# Patient Record
Sex: Female | Born: 1960 | Hispanic: Yes | Marital: Married | Smoking: Never smoker
Health system: Southern US, Community
[De-identification: ages and names within clinical notes are randomized; demographics above are authoritative.]

## PROBLEM LIST (undated history)

## (undated) DIAGNOSIS — I10 Essential (primary) hypertension: Secondary | ICD-10-CM

## (undated) DIAGNOSIS — K219 Gastro-esophageal reflux disease without esophagitis: Secondary | ICD-10-CM

---

## 2005-08-13 ENCOUNTER — Ambulatory Visit: Payer: Self-pay | Admitting: Family Medicine

## 2006-08-16 ENCOUNTER — Ambulatory Visit: Payer: Self-pay | Admitting: Family Medicine

## 2007-08-25 ENCOUNTER — Ambulatory Visit: Payer: Self-pay | Admitting: Certified Nurse Midwife

## 2008-09-07 ENCOUNTER — Ambulatory Visit: Payer: Self-pay | Admitting: Certified Nurse Midwife

## 2009-10-25 ENCOUNTER — Ambulatory Visit: Payer: Self-pay | Admitting: Certified Nurse Midwife

## 2013-05-20 ENCOUNTER — Ambulatory Visit: Payer: Self-pay | Admitting: Family Medicine

## 2014-06-01 ENCOUNTER — Ambulatory Visit: Payer: Self-pay | Admitting: Family Medicine

## 2014-10-04 ENCOUNTER — Emergency Department
Admit: 2014-10-04 | Disposition: A | Discharge status: Home / Self Care | Payer: Self-pay | Admitting: Emergency Medicine

## 2014-10-04 LAB — BASIC METABOLIC PANEL
Anion Gap: 7 (ref 7–16)
BUN: 12 mg/dL
CO2: 24 mmol/L
Calcium, Total: 8.8 mg/dL — ABNORMAL LOW
Chloride: 105 mmol/L
Creatinine: 0.8 mg/dL
EGFR (African American): >60
EGFR (Non-African Amer.): >60
GLUCOSE: 102 mg/dL — AB
POTASSIUM: 3.5 mmol/L
SODIUM: 136 mmol/L

## 2014-10-04 LAB — CBC
HCT: 40.1 % (ref 35.0–47.0)
HGB: 13.2 g/dL (ref 12.0–16.0)
MCH: 26.6 pg (ref 26.0–34.0)
MCHC: 32.8 g/dL (ref 32.0–36.0)
MCV: 81 fL (ref 80–100)
PLATELETS: 285 10*3/uL (ref 150–440)
RBC: 4.95 10*6/uL (ref 3.80–5.20)
RDW: 14.4 % (ref 11.5–14.5)
WBC: 8.6 10*3/uL (ref 3.6–11.0)

## 2014-10-04 LAB — TROPONIN I: Troponin-I: <0.03 ng/mL

## 2015-04-08 ENCOUNTER — Encounter: Payer: Self-pay | Admitting: *Deleted

## 2015-04-11 ENCOUNTER — Ambulatory Visit: Payer: TRICARE For Life (TFL) | Admitting: *Deleted

## 2015-04-11 ENCOUNTER — Ambulatory Visit
Admission: RE | Admit: 2015-04-11 | Discharge: 2015-04-11 | Disposition: A | Discharge status: Home / Self Care | Payer: TRICARE For Life (TFL) | Source: Ambulatory Visit | Attending: Unknown Physician Specialty | Admitting: Unknown Physician Specialty

## 2015-04-11 ENCOUNTER — Encounter
Admission: RE | Disposition: A | Discharge status: Home / Self Care | Payer: Self-pay | Source: Ambulatory Visit | Attending: Unknown Physician Specialty

## 2015-04-11 DIAGNOSIS — D124 Benign neoplasm of descending colon: Secondary | ICD-10-CM | POA: Insufficient documentation

## 2015-04-11 DIAGNOSIS — Z1211 Encounter for screening for malignant neoplasm of colon: Secondary | ICD-10-CM | POA: Diagnosis not present

## 2015-04-11 DIAGNOSIS — K640 First degree hemorrhoids: Secondary | ICD-10-CM | POA: Diagnosis not present

## 2015-04-11 DIAGNOSIS — Z79899 Other long term (current) drug therapy: Secondary | ICD-10-CM | POA: Diagnosis not present

## 2015-04-11 DIAGNOSIS — K219 Gastro-esophageal reflux disease without esophagitis: Secondary | ICD-10-CM | POA: Insufficient documentation

## 2015-04-11 DIAGNOSIS — I10 Essential (primary) hypertension: Secondary | ICD-10-CM | POA: Diagnosis not present

## 2015-04-11 HISTORY — PX: COLONOSCOPY WITH PROPOFOL: SHX5780

## 2015-04-11 HISTORY — DX: Essential (primary) hypertension: I10

## 2015-04-11 HISTORY — DX: Gastro-esophageal reflux disease without esophagitis: K21.9

## 2015-04-11 SURGERY — COLONOSCOPY WITH PROPOFOL
Anesthesia: General

## 2015-04-11 MED ORDER — SODIUM CHLORIDE 0.9 % IV SOLN
INTRAVENOUS | Status: DC
  Administered 2015-04-11: 14:00:00 via INTRAVENOUS

## 2015-04-11 MED ORDER — FENTANYL CITRATE (PF) 100 MCG/2ML IJ SOLN
INTRAMUSCULAR | Status: DC | PRN
  Administered 2015-04-11: 50 ug via INTRAVENOUS

## 2015-04-11 MED ORDER — MIDAZOLAM HCL 2 MG/2ML IJ SOLN
INTRAMUSCULAR | Status: DC | PRN
  Administered 2015-04-11: 1 mg via INTRAVENOUS

## 2015-04-11 MED ORDER — SODIUM CHLORIDE 0.9 % IV SOLN: INTRAVENOUS | Status: DC

## 2015-04-11 MED ORDER — PROPOFOL 500 MG/50ML IV EMUL
INTRAVENOUS | Status: DC | PRN
  Administered 2015-04-11: 120 ug/kg/min via INTRAVENOUS

## 2015-04-11 MED ORDER — PROPOFOL 10 MG/ML IV BOLUS
INTRAVENOUS | Status: DC | PRN
  Administered 2015-04-11: 30 mg via INTRAVENOUS

## 2015-04-11 MED ORDER — LIDOCAINE HCL (CARDIAC) 20 MG/ML IV SOLN
INTRAVENOUS | Status: DC | PRN
  Administered 2015-04-11: 40 mg via INTRAVENOUS

## 2015-04-11 NOTE — Op Note (Signed)
[facility] Gastroenterology Patient Name: Rachel Warner Procedure Date: 04/11/2015 8:51 AM MRN: [id number] Account #: [id number] Date of Birth: [birth year] Admit Type: Outpatient Age: 54 Room: [facility] ROOM 1 Gender: Female Note Status: Finalized Procedure:         Colonoscopy Indications:       Screening for colorectal malignant neoplasm Providers:         Manya Silvas, MD Referring MD:      Glendon Axe (Referring MD) Medicines:         Propofol per Anesthesia Complications:     No immediate complications. Procedure:         Pre-Anesthesia Assessment:                    - After reviewing the risks and benefits, the patient was                     deemed in satisfactory condition to undergo the procedure.                    After obtaining informed consent, the colonoscope was                     passed under direct vision. Throughout the procedure, the                     patient's blood pressure, pulse, and oxygen saturations                     were monitored continuously. The Colonoscope was                     introduced through the anus and advanced to the the cecum,                     identified by appendiceal orifice and ileocecal valve. The                     colonoscopy was performed without difficulty. The patient                     tolerated the procedure well. The quality of the bowel                     preparation was excellent. Findings:      A diminutive polyp was found in the descending colon. The polyp was       sessile. The polyp was removed with a jumbo cold forceps. Resection and       retrieval were complete.      A diminutive polyp was found in the descending colon. The polyp was       sessile. The polyp was removed with a jumbo cold forceps. Resection and       retrieval were complete.      Internal hemorrhoids were found during endoscopy. The hemorrhoids were       small and Grade I (internal hemorrhoids that do not  prolapse).      The exam was otherwise without abnormality. Impression:        - One diminutive polyp in the descending colon. Resected                     and retrieved.                    -  One diminutive polyp in the descending colon. Resected                     and retrieved.                    - Internal hemorrhoids.                    - The examination was otherwise normal. Recommendation:    - Await pathology results. Manya Silvas, MD 04/11/2015 2:57:13 PM This report has been signed electronically. Number of Addenda: 0 Note Initiated On: 04/11/2015 8:51 AM Scope Withdrawal Time: 0 hours 10 minutes 45 seconds  Total Procedure Duration: 0 hours 21 minutes 33 seconds       [facility]

## 2015-04-11 NOTE — Transfer of Care (Signed)
Immediate Anesthesia Transfer of Care Note  Patient: Rachel Warner  Procedure(s) Performed: Procedure(s): COLONOSCOPY WITH PROPOFOL (N/A)  Patient Location: PACU  Anesthesia Type:General  Level of Consciousness: awake, alert , oriented and sedated  Airway & Oxygen Therapy: Patient Spontanous Breathing and Patient connected to nasal cannula oxygen  Post-op Assessment: Report given to RN and Post -op Vital signs reviewed and stable  Post vital signs: Reviewed and stable  Last Vitals:  Filed Vitals:   04/11/15 1355  BP: 134/97  Pulse: 87  Temp: 35.7 C  Resp: 19    Complications: No apparent anesthesia complications

## 2015-04-11 NOTE — Anesthesia Postprocedure Evaluation (Signed)
  Anesthesia Post-op Note  Patient: Rachel Warner  Procedure(s) Performed: Procedure(s): COLONOSCOPY WITH PROPOFOL (N/A)  Anesthesia type:General  Patient location: PACU  Post pain: Pain level controlled  Post assessment: Post-op Vital signs reviewed, Patient's Cardiovascular Status Stable, Respiratory Function Stable, Patent Airway and No signs of Nausea or vomiting  Post vital signs: Reviewed and stable  Last Vitals:  Filed Vitals:   04/11/15 1355  BP: 134/97  Pulse: 87  Temp: 35.7 C  Resp: 19    Level of consciousness: awake, alert  and patient cooperative  Complications: No apparent anesthesia complications

## 2015-04-11 NOTE — H&P (Signed)
   Primary Care Physician:  Glendon Axe, MD Primary Gastroenterologist:  Dr. Vira Agar  Pre-Procedure History & Physical: HPI:  Rachel Warner is a 54 y.o. female is here for an colonoscopy.   Past Medical History  Diagnosis Date  . Hypertension   . GERD (gastroesophageal reflux disease)     History reviewed. No pertinent past surgical history.  Prior to Admission medications   Medication Sig Start Date End Date Taking? Authorizing Provider  amLODipine (NORVASC) 2.5 MG tablet Take 2.5 mg by mouth daily.   Yes Historical Provider, MD  fluticasone (FLONASE) 50 MCG/ACT nasal spray Place 2 sprays into both nostrils daily.   Yes Historical Provider, MD  levocetirizine (XYZAL) 5 MG tablet Take 5 mg by mouth every evening.   Yes Historical Provider, MD  lisinopril (PRINIVIL,ZESTRIL) 20 MG tablet Take 20 mg by mouth daily.   Yes Historical Provider, MD  meclizine (ANTIVERT) 25 MG tablet Take 25 mg by mouth 2 (two) times daily as needed for dizziness.   Yes Historical Provider, MD  meloxicam (MOBIC) 15 MG tablet Take 15 mg by mouth daily.   Yes Historical Provider, MD  omeprazole (PRILOSEC) 20 MG capsule Take 20 mg by mouth daily.   Yes Historical Provider, MD    Allergies as of 02/16/2015  . (Not on File)    History reviewed. No pertinent family history.  Social History   Social History  . Marital Status: Married    Spouse Name: N/A  . Number of Children: N/A  . Years of Education: N/A   Occupational History  . Not on file.   Social History Main Topics  . Smoking status: Never Smoker   . Smokeless tobacco: Never Used  . Alcohol Use: No  . Drug Use: No  . Sexual Activity: Not on file   Other Topics Concern  . Not on file   Social History Narrative  . No narrative on file    Review of Systems: See HPI, otherwise negative ROS  Physical Exam: BP 134/97 mmHg  Pulse 87  Temp(Src) 96.2 F (35.7 C) (Tympanic)  Resp 19  Ht 5\' 6"  (1.676 m)  Wt 83.915 kg (185 lb)  BMI  29.87 kg/m2  SpO2 100% General:   Alert,  pleasant and cooperative in NAD Head:  Normocephalic and atraumatic. Neck:  Supple; no masses or thyromegaly. Lungs:  Clear throughout to auscultation.    Heart:  Regular rate and rhythm. Abdomen:  Soft, nontender and nondistended. Normal bowel sounds, without guarding, and without rebound.   Neurologic:  Alert and  oriented x4;  grossly normal neurologically.  Impression/Plan: Rachel Warner is here for an colonoscopy to be performed for screening colon  Risks, benefits, limitations, and alternatives regarding  colonoscopy have been reviewed with the patient.  Questions have been answered.  All parties agreeable.   Gaylyn Cheers, MD  04/11/2015, 2:25 PM

## 2015-04-11 NOTE — Anesthesia Procedure Notes (Signed)
Performed by: COOK-MARTIN, Manning Luna Pre-anesthesia Checklist: Patient identified, Emergency Drugs available, Suction available, Patient being monitored and Timeout performed Patient Re-evaluated:Patient Re-evaluated prior to inductionOxygen Delivery Method: Nasal cannula Preoxygenation: Pre-oxygenation with 100% oxygen Intubation Type: IV induction Placement Confirmation: positive ETCO2 and CO2 detector       

## 2015-04-11 NOTE — Anesthesia Preprocedure Evaluation (Addendum)
Anesthesia Evaluation  Patient identified by MRN, date of birth, ID band Patient awake  General Assessment Comment:Hx and consent all via interpreter.  Reviewed: Allergy & Precautions, NPO status , Patient's Chart, lab work & pertinent test results  Airway Mallampati: II  TM Distance: >3 FB Neck ROM: Full    Dental  (+) Teeth Intact   Pulmonary    Pulmonary exam normal        Cardiovascular Exercise Tolerance: Good hypertension, Pt. on medications Normal cardiovascular exam     Neuro/Psych    GI/Hepatic GERD  Medicated and Controlled,  Endo/Other    Renal/GU      Musculoskeletal   Abdominal Normal abdominal exam  (+)   Peds  Hematology   Anesthesia Other Findings No menses for over 7 months and not sexually active.  Reproductive/Obstetrics                            Anesthesia Physical Anesthesia Plan  ASA: II  Anesthesia Plan: General   Post-op Pain Management:    Induction: Intravenous  Airway Management Planned: Nasal Cannula  Additional Equipment:   Intra-op Plan:   Post-operative Plan:   Informed Consent: I have reviewed the patients History and Physical, chart, labs and discussed the procedure including the risks, benefits and alternatives for the proposed anesthesia with the patient or authorized representative who has indicated his/her understanding and acceptance.     Plan Discussed with: CRNA  Anesthesia Plan Comments: (Via interpreter.)        Anesthesia Quick Evaluation

## 2015-04-12 ENCOUNTER — Encounter: Payer: Self-pay | Admitting: Unknown Physician Specialty

## 2015-04-13 LAB — SURGICAL PATHOLOGY

## 2015-04-27 ENCOUNTER — Other Ambulatory Visit: Payer: Self-pay | Admitting: Internal Medicine

## 2015-04-27 DIAGNOSIS — R51 Headache: Principal | ICD-10-CM

## 2015-04-27 DIAGNOSIS — R519 Headache, unspecified: Secondary | ICD-10-CM

## 2015-05-18 ENCOUNTER — Other Ambulatory Visit: Payer: Self-pay | Admitting: Internal Medicine

## 2015-05-18 ENCOUNTER — Ambulatory Visit
Admission: RE | Admit: 2015-05-18 | Discharge: 2015-05-18 | Disposition: A | Discharge status: Home / Self Care | Source: Ambulatory Visit | Attending: Internal Medicine | Admitting: Internal Medicine

## 2015-05-18 DIAGNOSIS — R51 Headache: Secondary | ICD-10-CM | POA: Insufficient documentation

## 2015-05-18 DIAGNOSIS — R42 Dizziness and giddiness: Secondary | ICD-10-CM | POA: Diagnosis not present

## 2015-05-18 DIAGNOSIS — R531 Weakness: Secondary | ICD-10-CM | POA: Insufficient documentation

## 2015-05-18 DIAGNOSIS — R519 Headache, unspecified: Secondary | ICD-10-CM

## 2015-05-18 MED ORDER — GADOBENATE DIMEGLUMINE 529 MG/ML IV SOLN
20.0000 mL | Freq: Once | INTRAVENOUS | Status: AC | PRN
  Administered 2015-05-18: 17 mL via INTRAVENOUS

## 2017-12-16 ENCOUNTER — Other Ambulatory Visit: Payer: Self-pay | Admitting: Internal Medicine

## 2017-12-16 DIAGNOSIS — R748 Abnormal levels of other serum enzymes: Secondary | ICD-10-CM

## 2018-08-06 ENCOUNTER — Other Ambulatory Visit: Payer: Self-pay | Admitting: Internal Medicine

## 2018-08-06 DIAGNOSIS — Z1231 Encounter for screening mammogram for malignant neoplasm of breast: Secondary | ICD-10-CM

## 2018-11-05 ENCOUNTER — Encounter

## 2019-06-16 ENCOUNTER — Ambulatory Visit
Admission: RE | Admit: 2019-06-16 | Discharge: 2019-06-16 | Disposition: A | Discharge status: Home / Self Care | Source: Ambulatory Visit | Attending: Internal Medicine | Admitting: Internal Medicine

## 2019-06-16 DIAGNOSIS — Z1231 Encounter for screening mammogram for malignant neoplasm of breast: Secondary | ICD-10-CM | POA: Insufficient documentation

## 2019-08-29 ENCOUNTER — Ambulatory Visit: Attending: Internal Medicine

## 2019-08-29 DIAGNOSIS — Z23 Encounter for immunization: Secondary | ICD-10-CM

## 2019-08-29 NOTE — Progress Notes (Signed)
   Covid-19 Vaccination Clinic  Name:  Rachel Warner    MRN: [id number] DOB: 1961-04-08  08/29/2019  [name] was observed post Covid-19 immunization for 15 minutes without incident. She was provided with Vaccine Information Sheet and instruction to access the V-Safe system.   [name] was instructed to call 911 with any severe reactions post vaccine: Marland Kitchen Difficulty breathing  . Swelling of face and throat  . A fast heartbeat  . A bad rash all over body  . Dizziness and weakness   Immunizations Administered    Name Date Dose VIS Date Route   Pfizer COVID-19 Vaccine 08/29/2019 11:00 AM 0.3 mL 05/22/2019 Intramuscular   Manufacturer: [place]   Lot: [id number]   [place]: [id number]

## 2019-09-19 ENCOUNTER — Ambulatory Visit: Attending: Internal Medicine

## 2019-09-19 DIAGNOSIS — Z23 Encounter for immunization: Secondary | ICD-10-CM

## 2019-09-19 NOTE — Progress Notes (Signed)
   Covid-19 Vaccination Clinic  Name:  Rachel Warner    MRN: [id number] DOB: 1960/07/01  09/19/2019  [name] was observed post Covid-19 immunization for 15 minutes   without incident. She was provided with Vaccine Information Sheet and instruction to access the V-Safe system.   [name] was instructed to call 911 with any severe reactions post vaccine: Marland Kitchen Difficulty breathing  . Swelling of face and throat  . A fast heartbeat  . A bad rash all over body  . Dizziness and weakness   Immunizations Administered    Name Date Dose VIS Date Route   Pfizer COVID-19 Vaccine 09/19/2019 10:08 AM 0.3 mL 05/22/2019 Intramuscular   Manufacturer: [place]   Lot: [id number]   [place]: [id number]

## 2020-10-07 ENCOUNTER — Other Ambulatory Visit: Payer: Self-pay

## 2020-10-07 DIAGNOSIS — Z1231 Encounter for screening mammogram for malignant neoplasm of breast: Secondary | ICD-10-CM

## 2020-10-13 ENCOUNTER — Encounter

## 2020-10-14 ENCOUNTER — Other Ambulatory Visit: Payer: Self-pay

## 2020-10-14 ENCOUNTER — Ambulatory Visit
Admission: RE | Admit: 2020-10-14 | Discharge: 2020-10-14 | Disposition: A | Discharge status: Home / Self Care | Source: Ambulatory Visit

## 2020-10-14 DIAGNOSIS — Z1231 Encounter for screening mammogram for malignant neoplasm of breast: Secondary | ICD-10-CM | POA: Diagnosis not present

## 2021-10-04 ENCOUNTER — Other Ambulatory Visit: Payer: Self-pay | Admitting: Family Medicine

## 2021-10-04 DIAGNOSIS — Z1231 Encounter for screening mammogram for malignant neoplasm of breast: Secondary | ICD-10-CM

## 2021-11-07 ENCOUNTER — Ambulatory Visit
Admission: RE | Admit: 2021-11-07 | Discharge: 2021-11-07 | Disposition: A | Discharge status: Home / Self Care | Source: Ambulatory Visit | Attending: Family Medicine | Admitting: Family Medicine

## 2021-11-07 DIAGNOSIS — Z1231 Encounter for screening mammogram for malignant neoplasm of breast: Secondary | ICD-10-CM | POA: Insufficient documentation

## 2022-08-01 IMAGING — MG MM DIGITAL SCREENING BILAT W/ TOMO AND CAD
8 series · 8 of 24 positions shown · non-contrast
Comparison: Previous exam(s).

CLINICAL DATA: Screening.

EXAM:
DIGITAL SCREENING BILATERAL MAMMOGRAM WITH TOMOSYNTHESIS AND CAD
TECHNIQUE: Bilateral screening digital craniocaudal and mediolateral oblique
mammograms were obtained. Bilateral screening digital breast
tomosynthesis was performed. The images were evaluated with
computer-aided detection.

[R CC synth-2D]
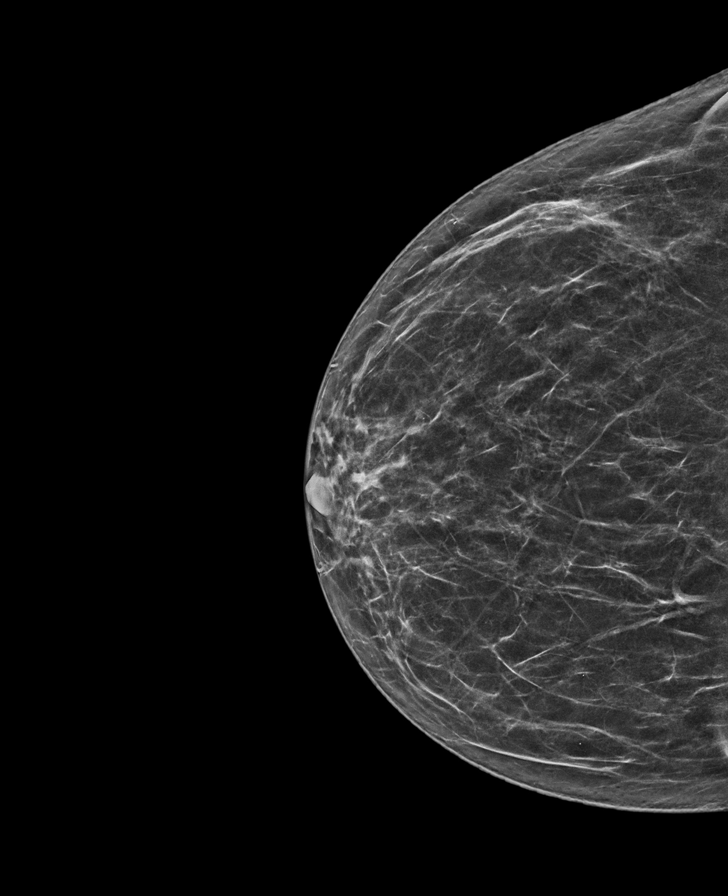

[R MLO synth-2D]
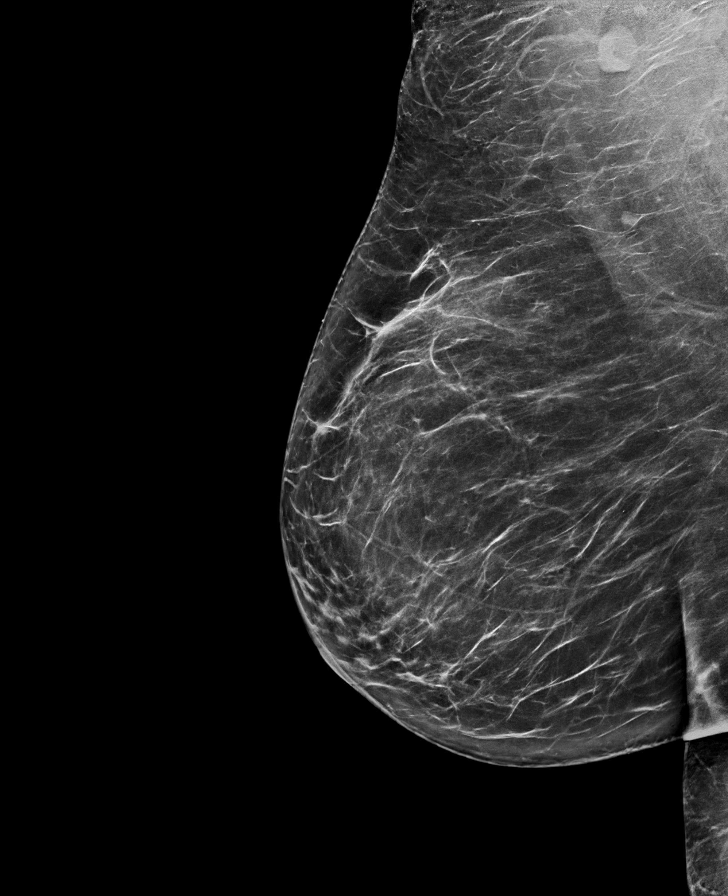

[L MLO synth-2D]
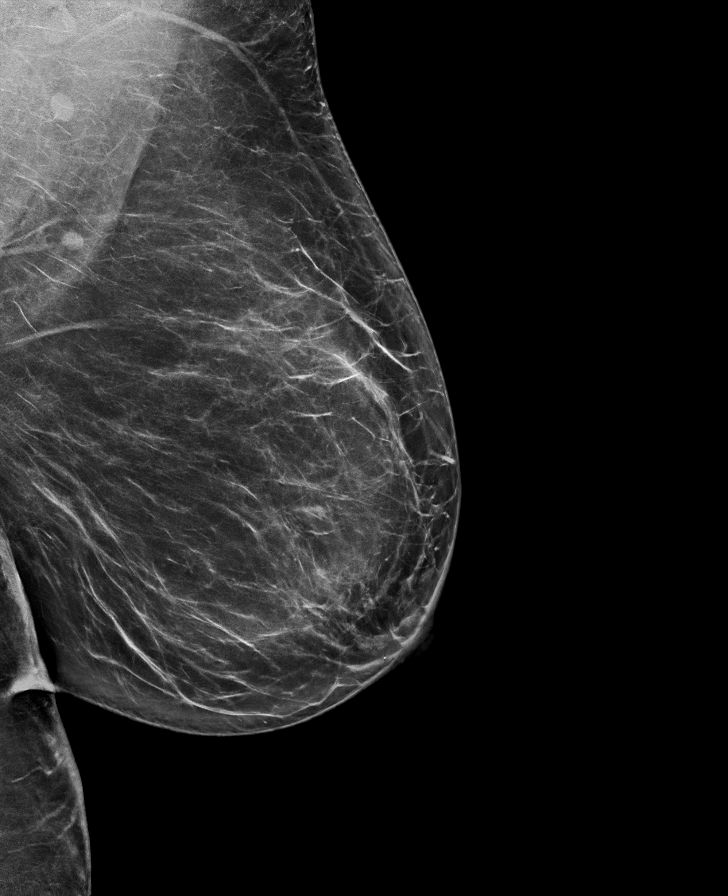

[L CC synth-2D]
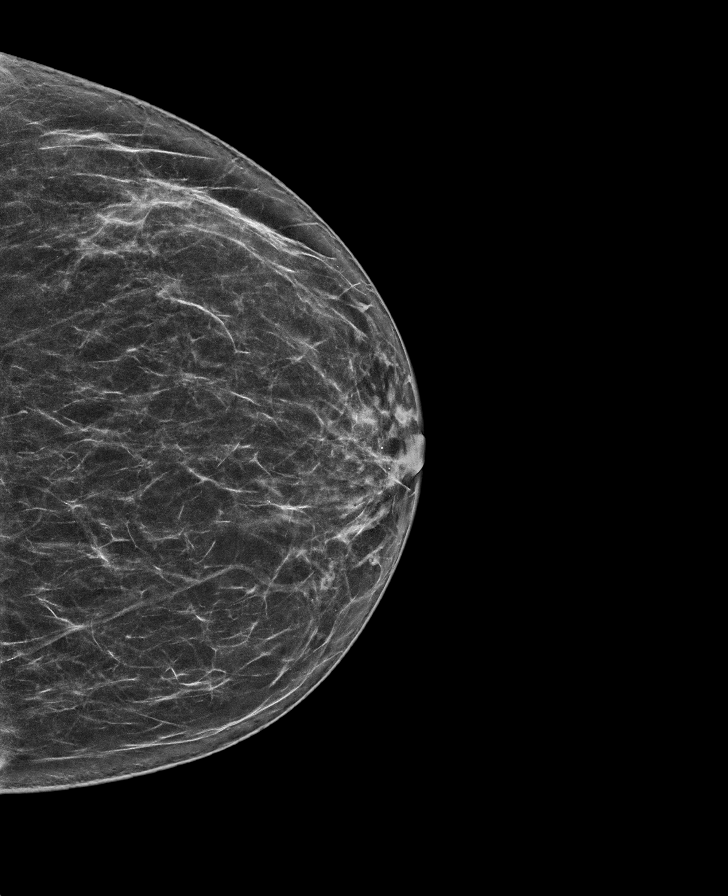

[R MLO tomo · tomo slice 43/86.0]
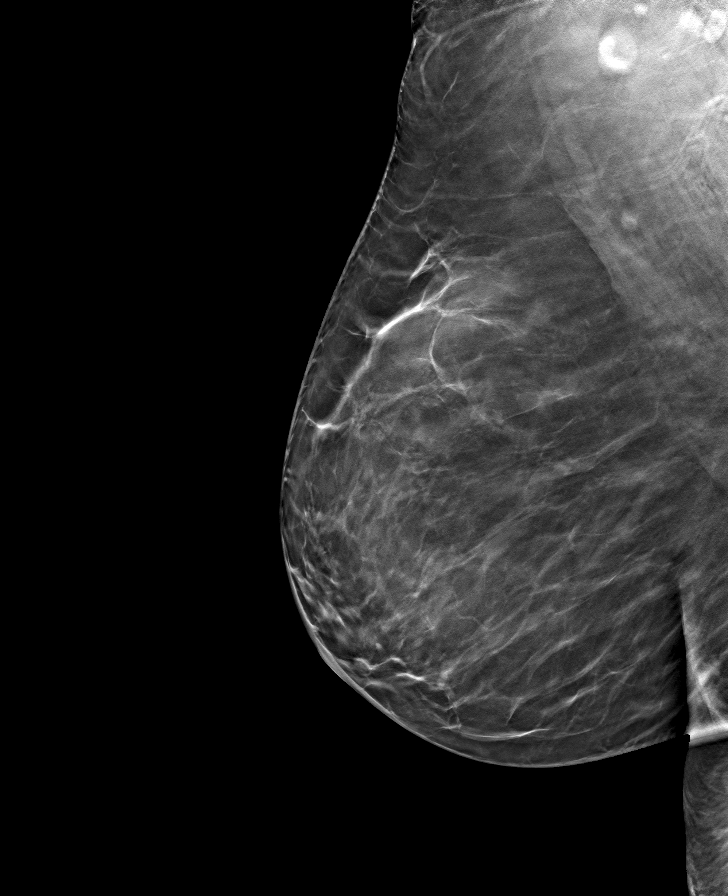

[R CC tomo · tomo slice 36/71.0]
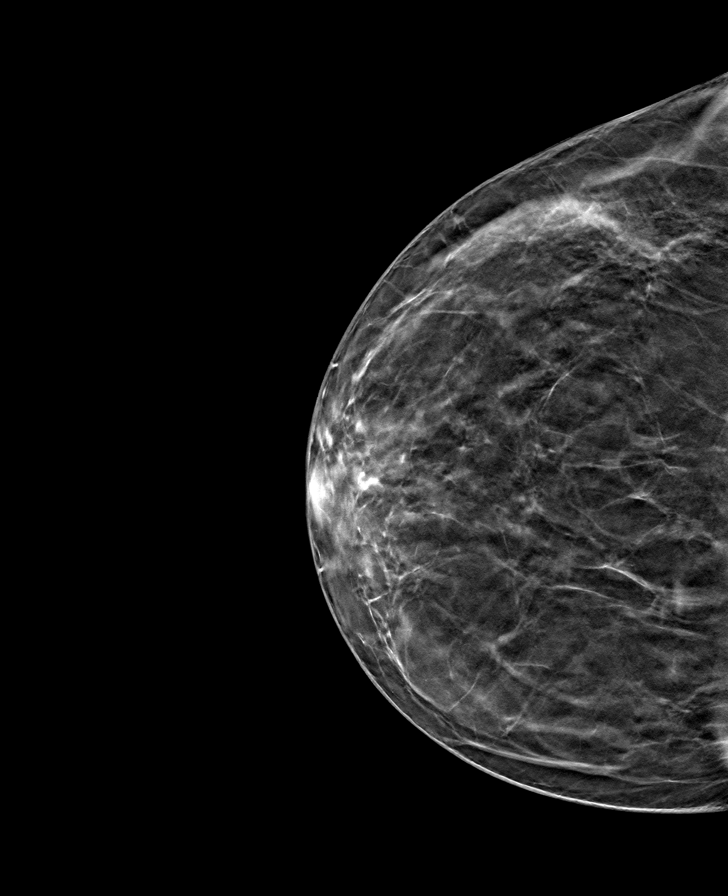

[L CC tomo · tomo slice 38/75.0]
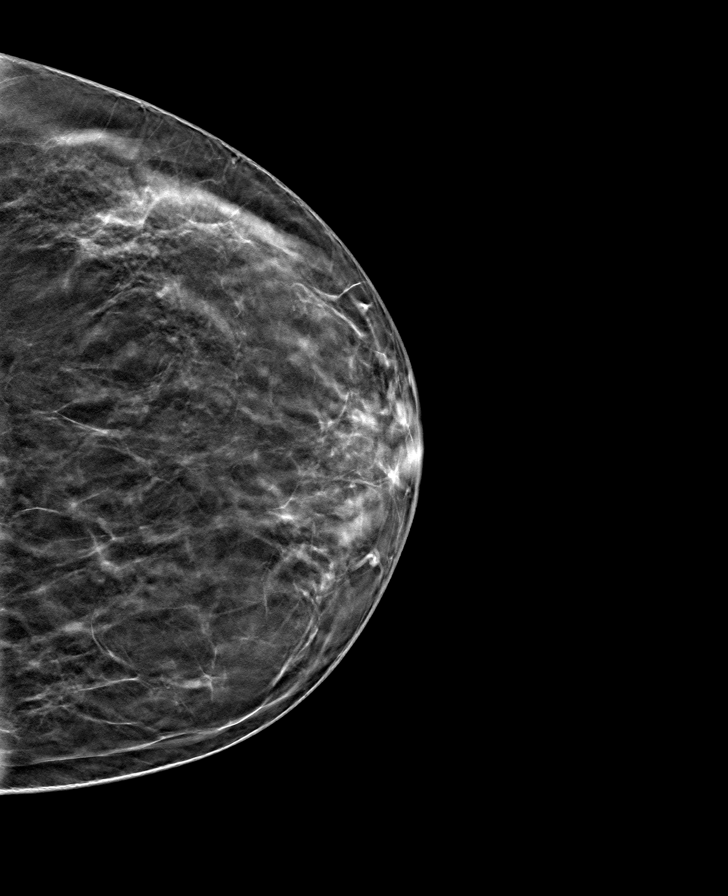

[L MLO tomo · tomo slice 47/92.0]
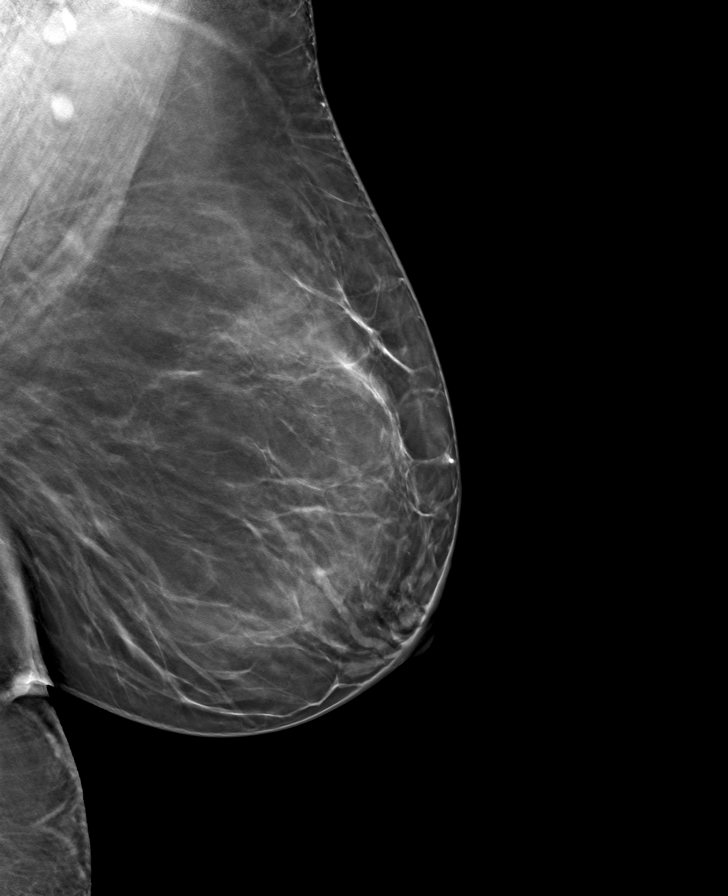

[8 of 24 positions shown; findings below may reference images not displayed]

ACR Breast Density Category b: There are scattered areas of
fibroglandular density.
FINDINGS: There are no findings suspicious for malignancy.
IMPRESSION: No mammographic evidence of malignancy. A result letter of this
screening mammogram will be mailed directly to the patient.

RECOMMENDATION:
Screening mammogram in one year. (Code:51-O-LD2)

BI-RADS CATEGORY  1: Negative.

## 2022-10-05 ENCOUNTER — Other Ambulatory Visit: Payer: Self-pay | Admitting: Family Medicine

## 2022-10-05 DIAGNOSIS — Z1231 Encounter for screening mammogram for malignant neoplasm of breast: Secondary | ICD-10-CM

## 2022-11-09 ENCOUNTER — Ambulatory Visit
Admission: RE | Admit: 2022-11-09 | Discharge: 2022-11-09 | Disposition: A | Discharge status: Home / Self Care | Source: Ambulatory Visit | Attending: Family Medicine | Admitting: Family Medicine

## 2022-11-09 DIAGNOSIS — Z1231 Encounter for screening mammogram for malignant neoplasm of breast: Secondary | ICD-10-CM | POA: Diagnosis present

## 2023-10-11 ENCOUNTER — Other Ambulatory Visit: Payer: Self-pay | Admitting: Family Medicine

## 2023-10-11 DIAGNOSIS — Z1231 Encounter for screening mammogram for malignant neoplasm of breast: Secondary | ICD-10-CM

## 2023-11-11 ENCOUNTER — Ambulatory Visit
Admission: RE | Admit: 2023-11-11 | Discharge: 2023-11-11 | Disposition: A | Discharge status: Home / Self Care | Source: Ambulatory Visit | Attending: Family Medicine | Admitting: Family Medicine

## 2023-11-11 DIAGNOSIS — Z1231 Encounter for screening mammogram for malignant neoplasm of breast: Secondary | ICD-10-CM | POA: Diagnosis present

## 2024-03-12 ENCOUNTER — Emergency Department

## 2024-03-12 ENCOUNTER — Other Ambulatory Visit: Payer: Self-pay

## 2024-03-12 ENCOUNTER — Emergency Department
Admission: EM | Admit: 2024-03-12 | Discharge: 2024-03-12 | Disposition: A | Discharge status: Home / Self Care | Attending: Emergency Medicine | Admitting: Emergency Medicine

## 2024-03-12 DIAGNOSIS — R112 Nausea with vomiting, unspecified: Secondary | ICD-10-CM | POA: Insufficient documentation

## 2024-03-12 DIAGNOSIS — R079 Chest pain, unspecified: Secondary | ICD-10-CM | POA: Insufficient documentation

## 2024-03-12 DIAGNOSIS — R42 Dizziness and giddiness: Secondary | ICD-10-CM | POA: Diagnosis not present

## 2024-03-12 DIAGNOSIS — M542 Cervicalgia: Secondary | ICD-10-CM | POA: Insufficient documentation

## 2024-03-12 DIAGNOSIS — R202 Paresthesia of skin: Secondary | ICD-10-CM | POA: Insufficient documentation

## 2024-03-12 DIAGNOSIS — I10 Essential (primary) hypertension: Secondary | ICD-10-CM | POA: Insufficient documentation

## 2024-03-12 LAB — URINALYSIS, ROUTINE W REFLEX MICROSCOPIC
Bilirubin Urine: NEGATIVE
Glucose, UA: NEGATIVE mg/dL
Hgb urine dipstick: NEGATIVE
Ketones, ur: NEGATIVE mg/dL
Nitrite: NEGATIVE
Protein, ur: NEGATIVE mg/dL
Specific Gravity, Urine: 1.008 (ref 1.005–1.030)
pH: 5 (ref 5.0–8.0)

## 2024-03-12 LAB — CBC WITH DIFFERENTIAL/PLATELET
Abs Immature Granulocytes: 0.01 K/uL (ref 0.00–0.07)
Basophils Absolute: 0 K/uL (ref 0.0–0.1)
Basophils Relative: 0 %
Eosinophils Absolute: 0.1 K/uL (ref 0.0–0.5)
Eosinophils Relative: 2 %
HCT: 41.5 % (ref 36.0–46.0)
Hemoglobin: 13.5 g/dL (ref 12.0–15.0)
Immature Granulocytes: 0 %
Lymphocytes Relative: 40 %
Lymphs Abs: 2.9 K/uL (ref 0.7–4.0)
MCH: 26.1 pg (ref 26.0–34.0)
MCHC: 32.5 g/dL (ref 30.0–36.0)
MCV: 80.3 fL (ref 80.0–100.0)
Monocytes Absolute: 0.6 K/uL (ref 0.1–1.0)
Monocytes Relative: 8 %
Neutro Abs: 3.6 K/uL (ref 1.7–7.7)
Neutrophils Relative %: 50 %
Platelets: 305 K/uL (ref 150–400)
RBC: 5.17 MIL/uL — ABNORMAL HIGH (ref 3.87–5.11)
RDW: 14.2 % (ref 11.5–15.5)
WBC: 7.3 K/uL (ref 4.0–10.5)
nRBC: 0 % (ref 0.0–0.2)

## 2024-03-12 LAB — RESP PANEL BY RT-PCR (RSV, FLU A&B, COVID)  RVPGX2
Influenza A by PCR: NEGATIVE
Influenza B by PCR: NEGATIVE
Resp Syncytial Virus by PCR: NEGATIVE
SARS Coronavirus 2 by RT PCR: NEGATIVE

## 2024-03-12 LAB — COMPREHENSIVE METABOLIC PANEL WITH GFR
ALT: 34 U/L (ref 0–44)
AST: 27 U/L (ref 15–41)
Albumin: 3.9 g/dL (ref 3.5–5.0)
Alkaline Phosphatase: 119 U/L (ref 38–126)
Anion gap: 12 (ref 5–15)
BUN: 15 mg/dL (ref 8–23)
CO2: 23 mmol/L (ref 22–32)
Calcium: 9.5 mg/dL (ref 8.9–10.3)
Chloride: 104 mmol/L (ref 98–111)
Creatinine, Ser: 0.67 mg/dL (ref 0.44–1.00)
GFR, Estimated: >60 mL/min (ref >60)
Glucose, Bld: 155 mg/dL — ABNORMAL HIGH (ref 70–99)
Potassium: 3 mmol/L — ABNORMAL LOW (ref 3.5–5.1)
Sodium: 139 mmol/L (ref 135–145)
Total Bilirubin: 0.5 mg/dL (ref 0.0–1.2)
Total Protein: 7.7 g/dL (ref 6.5–8.1)

## 2024-03-12 LAB — TROPONIN I (HIGH SENSITIVITY): Troponin I (High Sensitivity): 7 ng/L (ref <18)

## 2024-03-12 MED ORDER — OXYCODONE-ACETAMINOPHEN 5-325 MG PO TABS: 1.0000 | ORAL_TABLET | Freq: Four times a day (QID) | ORAL | 0 refills | Status: AC | PRN

## 2024-03-12 MED ORDER — FAMCICLOVIR 125 MG PO TABS: 125.0000 mg | ORAL_TABLET | Freq: Three times a day (TID) | ORAL | 0 refills | Status: AC

## 2024-03-12 MED ORDER — BACLOFEN 10 MG PO TABS: 10.0000 mg | ORAL_TABLET | Freq: Three times a day (TID) | ORAL | 0 refills | Status: AC

## 2024-03-12 MED ORDER — MORPHINE SULFATE (PF) 4 MG/ML IV SOLN
4.0000 mg | Freq: Once | INTRAVENOUS | Status: AC
  Administered 2024-03-12: 4 mg via INTRAVENOUS
  Filled 2024-03-12: qty 1

## 2024-03-12 MED ORDER — PREDNISONE 10 MG (21) PO TBPK: ORAL_TABLET | ORAL | 0 refills | Status: AC

## 2024-03-12 MED ORDER — SODIUM CHLORIDE 0.9 % IV BOLUS
1000.0000 mL | Freq: Once | INTRAVENOUS | Status: AC
  Administered 2024-03-12: 1000 mL via INTRAVENOUS

## 2024-03-12 MED ORDER — ONDANSETRON HCL 4 MG/2ML IJ SOLN
4.0000 mg | Freq: Once | INTRAMUSCULAR | Status: AC
  Administered 2024-03-12: 4 mg via INTRAVENOUS
  Filled 2024-03-12: qty 2

## 2024-03-12 NOTE — ED Notes (Signed)
Patient actively dry heaving in triage.

## 2024-03-12 NOTE — ED Triage Notes (Addendum)
 Patient states neck pain since she woke up this morning, denies injury.  Interpreter ID [id number]

## 2024-03-12 NOTE — ED Provider Notes (Signed)
 [facility] Provider Note    Event Date/Time   First MD Initiated Contact with Patient 03/12/24 1438     (approximate)   History   Neck Pain   HPI  Rachel Warner is a 63 y.o. female with history of hypertension, GERD presents emergency department complaining of neck pain for 2 to 3 days.  Severe pain today.  Had chest pain and dizziness associated with the neck pain.  Also her tongue began to tingle.  States still having tingling on the tongue.  States the pain along the right side of the head and into the ear is a burning stabbing pain.  Denies fever, chills.  No known exposure to COVID.  Has some nausea and vomiting associated with her symptoms.  Is not having any slurred speech, no chest pain at present, no cough or congestion.      Physical Exam   Triage Vital Signs: ED Triage Vitals  Encounter Vitals Group     BP 03/12/24 1405 (!) 143/91     Girls Systolic BP Percentile --      Girls Diastolic BP Percentile --      Boys Systolic BP Percentile --      Boys Diastolic BP Percentile --      Pulse Rate 03/12/24 1405 (!) 110     Resp 03/12/24 1405 18     Temp 03/12/24 1405 98.7 F (37.1 C)     Temp Source 03/12/24 1405 Oral     SpO2 03/12/24 1405 96 %     Weight --      Height --      Head Circumference --      Peak Flow --      Pain Score 03/12/24 1408 9     Pain Loc --      Pain Education --      Exclude from Growth Chart --     Most recent vital signs: Vitals:   03/12/24 1405  BP: (!) 143/91  Pulse: (!) 110  Resp: 18  Temp: 98.7 F (37.1 C)  SpO2: 96%     General: Awake, no distress.   CV:  Good peripheral perfusion.  Tachycardic at 110 Resp:  Normal effort. Lungs cta Abd:  No distention.   Other:  C-spine mildly tender, pain reproduced with forward flexion, no rash noted, ENT within normal limits, no deviation of her tongue on exam, no slurred speech, cranial nerves II through XII grossly intact   ED Results / Procedures /  Treatments   Labs (all labs ordered are listed, but only abnormal results are displayed) Labs Reviewed  COMPREHENSIVE METABOLIC PANEL WITH GFR - Abnormal; Notable for the following components:      Result Value   Potassium 3.0 (*)    Glucose, Bld 155 (*)    All other components within normal limits  CBC WITH DIFFERENTIAL/PLATELET - Abnormal; Notable for the following components:   RBC 5.17 (*)    All other components within normal limits  URINALYSIS, ROUTINE W REFLEX MICROSCOPIC - Abnormal; Notable for the following components:   Color, Urine STRAW (*)    APPearance HAZY (*)    Leukocytes,Ua SMALL (*)    Bacteria, UA MANY (*)    All other components within normal limits  RESP PANEL BY RT-PCR (RSV, FLU A&B, COVID)  [id number]  TROPONIN I (HIGH SENSITIVITY)     EKG  EKG   RADIOLOGY CT of the head and C-spine    PROCEDURES:  Procedures  Critical Care:  no Chief Complaint  Patient presents with   Neck Pain      MEDICATIONS ORDERED IN ED: Medications  morphine (PF) 4 MG/ML injection 4 mg (4 mg Intravenous Given 03/12/24 1549)  ondansetron (ZOFRAN) injection 4 mg (4 mg Intravenous Given 03/12/24 1549)  sodium chloride  0.9 % bolus 1,000 mL (0 mLs Intravenous Stopped 03/12/24 1800)     IMPRESSION / MDM / ASSESSMENT AND PLAN / ED COURSE  I reviewed the triage vital signs and the nursing notes.                              Differential diagnosis includes, but is not limited to, meningitis, tension headache, herpes zoster, cervical radiculopathy, CVA, SAH, TIA, MI, GERD, PE  Patient's presentation is most consistent with acute presentation with potential threat to life or bodily function.   Medications given: Morphine 4 mg IV, Zofran 4 mg IV  The patient is tachycardic however I do feel like this is from pain, will reassess her pulse rate following pain medication and fluids.  If still elevated would consider PE, however her presentation is not very typical of a PE do  not see any red flags to indicate PE other than elevated heart rate.  No fever/chills, meningitis less likely, will evaluate CTs initially, blood cell count etc.  Labs, EKG, chest x-ray, CT head and C-spine ordered  EKG showed normal sinus rhythm, no elevated T waves, I interpret this as being normal, see physician read  Independent review of chest x-ray images and radiologist report, interpret this as being negative  Labs are reassuring, potassium is a little low encourage patient to eat potassium rich foods  Patient is feeling better after the morphine, Zofran, states pain has gotten much better, tingling has subsided on the tongue.  Still having pain in the right ear.  States not as bad as earlier.  Did talk to the patient about whether she felt like she needed to be admitted at this time, she states now that she is feeling better.  I agree that she is stable to be managed outpatient.  Do not see any red flags to further the workup at this time.  She was given a prescription for Famvir in case this is early zoster neuropathy, Sterapred, muscle relaxer, and a few narcotic pain pills.  She is to return the emergency department if she is worsening.  Explained to her I do not think she has had a stroke at this time CT is normal, tingling has subsided, she had no other concerns for stroke on exam.  Therefore I feel she is stable for discharge.  She is in agreement with this treatment plan.  Discharged stable condition.  Follow-up with cardiology if continued chest pain intermittently.  Patient has had no chest pain since earlier this morning so do not feel we need second troponin.        FINAL CLINICAL IMPRESSION(S) / ED DIAGNOSES   Final diagnoses:  Neck pain  Tingling sensation  Chest pain, unspecified type     Rx / DC Orders   ED Discharge Orders          Ordered    famciclovir (FAMVIR) 125 MG tablet  3 times daily        03/12/24 1755    predniSONE (STERAPRED UNI-PAK 21 TAB) 10 MG  (21) TBPK tablet        03/12/24 1755  baclofen (LIORESAL) 10 MG tablet  3 times daily        03/12/24 1755    oxyCODONE-acetaminophen (PERCOCET) 5-325 MG tablet  Every 6 hours PRN        03/12/24 1755             Note:  This document was prepared using Dragon voice recognition software and may include unintentional dictation errors.    Gasper Devere ORN, PA-C 03/12/24 SHAUNNA    Arlander Charleston, MD 03/12/24 [phone]

## 2024-03-17 ENCOUNTER — Other Ambulatory Visit (HOSPITAL_COMMUNITY): Payer: Self-pay
